# Patient Record
Sex: Female | Born: 1965 | Race: Asian | Hispanic: No | Marital: Married | Smoking: Never smoker
Health system: Southern US, Community
[De-identification: ages and names within clinical notes are randomized; demographics above are authoritative.]

## PROBLEM LIST (undated history)

## (undated) DIAGNOSIS — Z9109 Other allergy status, other than to drugs and biological substances: Secondary | ICD-10-CM

## (undated) DIAGNOSIS — E559 Vitamin D deficiency, unspecified: Secondary | ICD-10-CM

## (undated) DIAGNOSIS — K219 Gastro-esophageal reflux disease without esophagitis: Secondary | ICD-10-CM

## (undated) DIAGNOSIS — T7840XA Allergy, unspecified, initial encounter: Secondary | ICD-10-CM

## (undated) HISTORY — DX: Vitamin D deficiency, unspecified: E55.9

## (undated) HISTORY — PX: ESOPHAGOGASTRODUODENOSCOPY: SHX1529

## (undated) HISTORY — DX: Allergy, unspecified, initial encounter: T78.40XA

---

## 2001-11-16 HISTORY — PX: OVARIAN CYST REMOVAL: SHX89

## 2004-12-02 ENCOUNTER — Ambulatory Visit: Payer: Self-pay | Admitting: Obstetrics and Gynecology

## 2006-11-03 ENCOUNTER — Ambulatory Visit: Payer: Self-pay | Admitting: Obstetrics and Gynecology

## 2007-11-30 ENCOUNTER — Ambulatory Visit: Payer: Self-pay | Admitting: Obstetrics and Gynecology

## 2008-01-06 ENCOUNTER — Ambulatory Visit: Payer: Self-pay | Admitting: Otolaryngology

## 2008-07-02 ENCOUNTER — Ambulatory Visit: Payer: Self-pay | Admitting: Gastroenterology

## 2008-12-04 ENCOUNTER — Ambulatory Visit: Payer: Self-pay | Admitting: Obstetrics and Gynecology

## 2009-01-14 ENCOUNTER — Ambulatory Visit: Payer: Self-pay | Admitting: Gastroenterology

## 2009-12-05 ENCOUNTER — Ambulatory Visit: Payer: Self-pay | Admitting: Obstetrics and Gynecology

## 2011-10-06 ENCOUNTER — Ambulatory Visit: Payer: Self-pay | Admitting: Obstetrics and Gynecology

## 2012-11-17 ENCOUNTER — Ambulatory Visit: Payer: Self-pay | Admitting: Obstetrics and Gynecology

## 2012-12-21 ENCOUNTER — Ambulatory Visit: Payer: Self-pay | Admitting: Family Medicine

## 2013-11-20 ENCOUNTER — Ambulatory Visit: Payer: Self-pay | Admitting: Obstetrics and Gynecology

## 2014-09-21 ENCOUNTER — Ambulatory Visit: Payer: Self-pay

## 2015-11-15 ENCOUNTER — Encounter: Payer: Self-pay | Admitting: Family Medicine

## 2015-12-02 ENCOUNTER — Encounter: Payer: Self-pay | Admitting: Family Medicine

## 2015-12-02 ENCOUNTER — Ambulatory Visit (INDEPENDENT_AMBULATORY_CARE_PROVIDER_SITE_OTHER): Payer: BLUE CROSS/BLUE SHIELD | Admitting: Family Medicine

## 2015-12-02 VITALS — BP 116/62 | HR 99 | Temp 98.6°F | Resp 16 | Ht 63.0 in | Wt 129.9 lb

## 2015-12-02 DIAGNOSIS — J302 Other seasonal allergic rhinitis: Secondary | ICD-10-CM | POA: Insufficient documentation

## 2015-12-02 DIAGNOSIS — Z0000 Encounter for general adult medical examination without abnormal findings: Secondary | ICD-10-CM

## 2015-12-02 DIAGNOSIS — R1013 Epigastric pain: Secondary | ICD-10-CM | POA: Diagnosis not present

## 2015-12-02 MED ORDER — MOMETASONE FUROATE 50 MCG/ACT NA SUSP: 2.0000 | Freq: Every day | NASAL | Status: AC

## 2015-12-02 NOTE — Progress Notes (Signed)
Name: Magdalyn Hoover   MRN: [id number]    DOB: 07-Jun-1966   Date:12/02/2015       Progress Note  Subjective  Chief Complaint  Chief Complaint  Patient presents with  . Annual Exam    HPI  Pt. Is here for Annual Physical Exam. She is doing well.  Past Medical History  Diagnosis Date  . Allergy   . Vitamin D deficiency     No past surgical history on file.  Family History  Problem Relation Age of Onset  . Cancer Brother     testicular    Social History   Social History  . Marital Status: Married    Spouse Name: N/A  . Number of Children: N/A  . Years of Education: N/A   Occupational History  . Not on file.   Social History Main Topics  . Smoking status: Never Smoker   . Smokeless tobacco: Never Used  . Alcohol Use: No  . Drug Use: No  . Sexual Activity: Not Currently   Other Topics Concern  . Not on file   Social History Narrative  . No narrative on file    Current outpatient prescriptions:  .  Loratadine 10 MG CAPS, Take 1 tablet by mouth as needed., Disp: , Rfl:  .  mometasone (NASONEX) 50 MCG/ACT nasal spray, Place 2 sprays into the nose daily., Disp: , Rfl:  .  Vitamin D, Ergocalciferol, (DRISDOL) 50000 units CAPS capsule, Take 1 capsule by mouth daily., Disp: , Rfl: 0  Allergies  Allergen Reactions  . Shellfish Allergy Hives    Review of Systems  Constitutional: Positive for malaise/fatigue (fatigue before menstrual cycle). Negative for fever, chills and weight loss.  HENT: Negative for congestion, ear pain and sore throat.   Eyes: Negative for blurred vision and double vision.  Respiratory: Negative for cough and shortness of breath.   Cardiovascular: Positive for palpitations (when she gets nervous/anxious). Negative for chest pain and leg swelling.  Gastrointestinal: Positive for abdominal pain (periodic left sided upper abdominal pain, an episode may last for a week, worse with stress.). Negative for heartburn, nausea, vomiting, diarrhea,  constipation, blood in stool and melena.  Genitourinary: Negative for dysuria, urgency, frequency and hematuria.  Musculoskeletal: Negative for myalgias (muscle cramps ), back pain and joint pain.  Skin: Negative for itching and rash.  Neurological: Negative for dizziness, focal weakness and headaches.  Psychiatric/Behavioral: Negative for depression. The patient is nervous/anxious and has insomnia.     Objective  Filed Vitals:   12/02/15 1115  BP: 116/62  Pulse: 99  Temp: 98.6 F (37 C)  TempSrc: Oral  Resp: 16  Height: 5\' 3"  (1.6 m)  Weight: 129 lb 14.4 oz (58.922 kg)  SpO2: 99%    Physical Exam  Constitutional: She is oriented to person, place, and time and well-developed, well-nourished, and in no distress.  HENT:  Head: Normocephalic and atraumatic.  Right Ear: Tympanic membrane, external ear and ear canal normal.  Left Ear: Tympanic membrane, external ear and ear canal normal.  Mouth/Throat: Oropharynx is clear and moist. No posterior oropharyngeal erythema.  Bilateral turbinate hypertrophy, inflamed, scant rhinorrhea  Cardiovascular: Normal rate and regular rhythm.   Pulmonary/Chest: Effort normal and breath sounds normal.  Abdominal: Normal appearance. There is no splenomegaly. There is tenderness in the epigastric area and left upper quadrant.  Mild tenderness to palpation over the LUQ and epigastric region, no rebound.  Musculoskeletal:       Right ankle: She exhibits no swelling.  Left ankle: She exhibits no swelling.  Neurological: She is alert and oriented to person, place, and time.  Skin: Skin is warm.  Nursing note and vitals reviewed.   Assessment & Plan  1. Annual physical exam  - CBC with Differential - Comprehensive Metabolic Panel (CMET)  2. Epigastric abdominal pain We will refer to GI for endoscopy to rule out gastric or peptic ulcer disease. - Ambulatory referral to Gastroenterology  3. Seasonal allergies  - mometasone (NASONEX) 50  MCG/ACT nasal spray; Place 2 sprays into the nose daily.  Dispense: 17 g; Refill: 1   Holly Hoover [facility] 12/02/2015 11:26 AM

## 2015-12-03 LAB — COMPREHENSIVE METABOLIC PANEL
ALT: 8 IU/L (ref 0–32)
AST: 17 IU/L (ref 0–40)
Albumin/Globulin Ratio: 1.8 (ref 1.1–2.5)
Albumin: 4.2 g/dL (ref 3.5–5.5)
Alkaline Phosphatase: 59 IU/L (ref 39–117)
BUN/Creatinine Ratio: 15 (ref 9–23)
BUN: 11 mg/dL (ref 6–24)
Bilirubin Total: 0.9 mg/dL (ref 0.0–1.2)
CALCIUM: 9 mg/dL (ref 8.7–10.2)
CO2: 28 mmol/L (ref 18–29)
CREATININE: 0.74 mg/dL (ref 0.57–1.00)
Chloride: 101 mmol/L (ref 96–106)
GFR calc Af Amer: 110 mL/min/{1.73_m2} (ref >59)
GFR, EST NON AFRICAN AMERICAN: 95 mL/min/{1.73_m2} (ref >59)
GLOBULIN, TOTAL: 2.4 g/dL (ref 1.5–4.5)
Glucose: 99 mg/dL (ref 65–99)
Potassium: 4.2 mmol/L (ref 3.5–5.2)
Sodium: 140 mmol/L (ref 134–144)
TOTAL PROTEIN: 6.6 g/dL (ref 6.0–8.5)

## 2015-12-03 LAB — CBC WITH DIFFERENTIAL/PLATELET
Basophils Absolute: 0 10*3/uL (ref 0.0–0.2)
Basos: 0 %
EOS (ABSOLUTE): 0 10*3/uL (ref 0.0–0.4)
EOS: 1 %
HEMATOCRIT: 37.1 % (ref 34.0–46.6)
Hemoglobin: 12.2 g/dL (ref 11.1–15.9)
IMMATURE GRANS (ABS): 0 10*3/uL (ref 0.0–0.1)
IMMATURE GRANULOCYTES: 0 %
LYMPHS: 27 %
Lymphocytes Absolute: 1.2 10*3/uL (ref 0.7–3.1)
MCH: 29.9 pg (ref 26.6–33.0)
MCHC: 32.9 g/dL (ref 31.5–35.7)
MCV: 91 fL (ref 79–97)
Monocytes Absolute: 0.3 10*3/uL (ref 0.1–0.9)
Monocytes: 6 %
NEUTROS PCT: 66 %
Neutrophils Absolute: 3 10*3/uL (ref 1.4–7.0)
PLATELETS: 226 10*3/uL (ref 150–379)
RBC: 4.08 x10E6/uL (ref 3.77–5.28)
RDW: 15.2 % (ref 12.3–15.4)
WBC: 4.4 10*3/uL (ref 3.4–10.8)

## 2015-12-24 ENCOUNTER — Other Ambulatory Visit: Payer: Self-pay | Admitting: Obstetrics and Gynecology

## 2015-12-24 DIAGNOSIS — Z1231 Encounter for screening mammogram for malignant neoplasm of breast: Secondary | ICD-10-CM

## 2015-12-25 ENCOUNTER — Ambulatory Visit
Admission: RE | Admit: 2015-12-25 | Discharge: 2015-12-25 | Disposition: A | Discharge status: Home / Self Care | Payer: BLUE CROSS/BLUE SHIELD | Source: Ambulatory Visit | Attending: Obstetrics and Gynecology | Admitting: Obstetrics and Gynecology

## 2015-12-25 DIAGNOSIS — Z1231 Encounter for screening mammogram for malignant neoplasm of breast: Secondary | ICD-10-CM | POA: Insufficient documentation

## 2016-01-06 DIAGNOSIS — R197 Diarrhea, unspecified: Secondary | ICD-10-CM | POA: Insufficient documentation

## 2016-01-07 ENCOUNTER — Ambulatory Visit (INDEPENDENT_AMBULATORY_CARE_PROVIDER_SITE_OTHER): Payer: BLUE CROSS/BLUE SHIELD | Admitting: Gastroenterology

## 2016-01-07 ENCOUNTER — Encounter: Payer: Self-pay | Admitting: Gastroenterology

## 2016-01-07 VITALS — BP 130/70 | HR 89 | Temp 98.3°F | Ht 63.0 in | Wt 130.0 lb

## 2016-01-07 DIAGNOSIS — G8929 Other chronic pain: Secondary | ICD-10-CM | POA: Diagnosis not present

## 2016-01-07 DIAGNOSIS — R1013 Epigastric pain: Secondary | ICD-10-CM

## 2016-01-07 NOTE — Progress Notes (Signed)
Gastroenterology Consultation  Referring Provider:     Roselee Nova, MD Primary Care Physician:  Keith Rake, MD Primary Gastroenterologist:  Dr. Allen Norris     Reason for Consultation:     Abdominal pain        HPI:   Holly Hoover is a 50 y.o. y/o female referred for consultation & management of abdominal pain by Dr. Keith Rake, MD.  This patient comes today with a history of abdominal pain for many years. The patient reports that she had an upper endoscopy and probably 7 years ago by Dr. Gustavo Lah. The patient was told that she may have irritable bowel syndrome. The patient reports that her abdominal pain is worse with some foods. She also reports that her abdominal pain is worse when she is out in the cold weather. There is no report of any unexplained weight loss. The patient does also state that when she gets anxious her pain increases like when she is about to fly back to [place] to visit family. She denies any black stools or bloody stools. She also denies ever having a colonoscopy in the past. There is no history of any family members with colon cancer colon polyps.  Past Medical History  Diagnosis Date  . Allergy   . Vitamin D deficiency     Past Surgical History  Procedure Laterality Date  . Ovarian cyst removal  2003    Prior to Admission medications   Medication Sig Start Date End Date Taking? Authorizing Provider  Loratadine 10 MG CAPS Take 1 tablet by mouth as needed.   Yes Historical Provider, MD  mometasone (NASONEX) 50 MCG/ACT nasal spray Place 2 sprays into the nose daily. 12/02/15  Yes Roselee Nova, MD  Vitamin D, Ergocalciferol, (DRISDOL) 50000 units CAPS capsule Take 1 capsule by mouth daily. 11/27/15  Yes Historical Provider, MD    Family History  Problem Relation Age of Onset  . Cancer Brother     testicular     Social History  Substance Use Topics  . Smoking status: Never Smoker   . Smokeless tobacco: Never Used  . Alcohol Use: No    Allergies as of  01/07/2016 - Review Complete 01/07/2016  Allergen Reaction Noted  . Lactose intolerance (gi)  01/07/2016  . Shellfish allergy Hives 12/02/2015    Review of Systems:    All systems reviewed and negative except where noted in HPI.   Physical Exam:  BP 130/70 mmHg  Pulse 89  Temp(Src) 98.3 F (36.8 C)  Ht 5\' 3"  (1.6 m)  Wt 130 lb (58.968 kg)  BMI 23.03 kg/m2 No LMP recorded. Patient is not currently having periods (Reason: Perimenopausal). Psych:  Alert and cooperative. Normal mood and affect. General:   Alert,  Well-developed, well-nourished, pleasant and cooperative in NAD Head:  Normocephalic and atraumatic. Eyes:  Sclera clear, no icterus.   Conjunctiva pink. Ears:  Normal auditory acuity. Nose:  No deformity, discharge, or lesions. Mouth:  No deformity or lesions,oropharynx pink & moist. Neck:  Supple; no masses or thyromegaly. Lungs:  Respirations even and unlabored.  Clear throughout to auscultation.   No wheezes, crackles, or rhonchi. No acute distress. Heart:  Regular rate and rhythm; no murmurs, clicks, rubs, or gallops. Abdomen:  Normal bowel sounds.  No bruits.  Soft, non-tender and non-distended without masses, hepatosplenomegaly or hernias noted.  No guarding or rebound tenderness.  Negative Carnett sign.   Rectal:  Deferred.  Msk:  Symmetrical without gross deformities.  Good, equal movement & strength bilaterally. Pulses:  Normal pulses noted. Extremities:  No clubbing or edema.  No cyanosis. Neurologic:  Alert and oriented x3;  grossly normal neurologically. Skin:  Intact without significant lesions or rashes.  No jaundice. Lymph Nodes:  No significant cervical adenopathy. Psych:  Alert and cooperative. Normal mood and affect.  Imaging Studies: Mm Digital Screening Bilateral  12/25/2015  CLINICAL DATA:  Screening. EXAM: DIGITAL SCREENING BILATERAL MAMMOGRAM WITH CAD COMPARISON:  Previous exam(s). ACR Breast Density Category d: The breast tissue is extremely dense,  which lowers the sensitivity of mammography. FINDINGS: There are no findings suspicious for malignancy. Images were processed with CAD. IMPRESSION: No mammographic evidence of malignancy. A result letter of this screening mammogram will be mailed directly to the patient. RECOMMENDATION: Screening mammogram in one year. (Code:SM-B-01Y) BI-RADS CATEGORY  1: Negative. Electronically Signed   By: Ammie Ferrier M.D.   On: 12/25/2015 16:15    Assessment and Plan:   Terri Pele is a 50 y.o. y/o female who comes in today with chronic abdominal pain that appears to the getting worse recently. The patient states that the abdominal pain is in the epigastric area. The patient has tried 4 different types of PPIs in the past and she states that it works for a few days and then starts to cause her to have more pain. The patient will be set up for an EGD to look for a cause of her epigastric pain. The patient will also be set up or a colonoscopy for screening purposes. I have discussed risks & benefits which include, but are not limited to, bleeding, infection, perforation & drug reaction.  The patient agrees with this plan & written consent will be obtained.                                      Note: This dictation was prepared with Dragon dictation along with smaller phrase technology. Any transcriptional errors that result from this process are unintentional.

## 2016-01-08 ENCOUNTER — Other Ambulatory Visit: Payer: Self-pay

## 2016-01-27 ENCOUNTER — Encounter: Payer: Self-pay | Admitting: *Deleted

## 2016-01-30 NOTE — Discharge Instructions (Signed)

## 2016-02-03 ENCOUNTER — Ambulatory Visit: Payer: BLUE CROSS/BLUE SHIELD | Admitting: Anesthesiology

## 2016-02-03 ENCOUNTER — Encounter
Admission: RE | Disposition: A | Discharge status: Home / Self Care | Payer: Self-pay | Source: Ambulatory Visit | Attending: Gastroenterology

## 2016-02-03 ENCOUNTER — Ambulatory Visit
Admission: RE | Admit: 2016-02-03 | Discharge: 2016-02-03 | Disposition: A | Discharge status: Home / Self Care | Payer: BLUE CROSS/BLUE SHIELD | Source: Ambulatory Visit | Attending: Gastroenterology | Admitting: Gastroenterology

## 2016-02-03 DIAGNOSIS — Z79899 Other long term (current) drug therapy: Secondary | ICD-10-CM | POA: Insufficient documentation

## 2016-02-03 DIAGNOSIS — R12 Heartburn: Secondary | ICD-10-CM | POA: Diagnosis not present

## 2016-02-03 DIAGNOSIS — E559 Vitamin D deficiency, unspecified: Secondary | ICD-10-CM | POA: Insufficient documentation

## 2016-02-03 DIAGNOSIS — D120 Benign neoplasm of cecum: Secondary | ICD-10-CM | POA: Diagnosis not present

## 2016-02-03 DIAGNOSIS — K2970 Gastritis, unspecified, without bleeding: Secondary | ICD-10-CM | POA: Insufficient documentation

## 2016-02-03 DIAGNOSIS — K219 Gastro-esophageal reflux disease without esophagitis: Secondary | ICD-10-CM | POA: Insufficient documentation

## 2016-02-03 DIAGNOSIS — D125 Benign neoplasm of sigmoid colon: Secondary | ICD-10-CM | POA: Diagnosis not present

## 2016-02-03 DIAGNOSIS — Z1211 Encounter for screening for malignant neoplasm of colon: Secondary | ICD-10-CM | POA: Insufficient documentation

## 2016-02-03 DIAGNOSIS — Z91013 Allergy to seafood: Secondary | ICD-10-CM | POA: Insufficient documentation

## 2016-02-03 DIAGNOSIS — E739 Lactose intolerance, unspecified: Secondary | ICD-10-CM | POA: Insufficient documentation

## 2016-02-03 DIAGNOSIS — D122 Benign neoplasm of ascending colon: Secondary | ICD-10-CM

## 2016-02-03 HISTORY — DX: Other allergy status, other than to drugs and biological substances: Z91.09

## 2016-02-03 HISTORY — PX: ESOPHAGOGASTRODUODENOSCOPY (EGD) WITH PROPOFOL: SHX5813

## 2016-02-03 HISTORY — PX: COLONOSCOPY WITH PROPOFOL: SHX5780

## 2016-02-03 HISTORY — PX: POLYPECTOMY: SHX5525

## 2016-02-03 HISTORY — DX: Gastro-esophageal reflux disease without esophagitis: K21.9

## 2016-02-03 SURGERY — COLONOSCOPY WITH PROPOFOL
Anesthesia: Monitor Anesthesia Care | Wound class: Contaminated

## 2016-02-03 MED ORDER — PROPOFOL 10 MG/ML IV BOLUS
INTRAVENOUS | Status: DC | PRN
  Administered 2016-02-03: 30 mg via INTRAVENOUS
  Administered 2016-02-03: 120 mg via INTRAVENOUS
  Administered 2016-02-03 (×2): 30 mg via INTRAVENOUS

## 2016-02-03 MED ORDER — GLYCOPYRROLATE 0.2 MG/ML IJ SOLN
INTRAMUSCULAR | Status: DC | PRN
  Administered 2016-02-03: 0.1 mg via INTRAVENOUS

## 2016-02-03 MED ORDER — LIDOCAINE HCL (CARDIAC) 20 MG/ML IV SOLN
INTRAVENOUS | Status: DC | PRN
  Administered 2016-02-03: 50 mg via INTRAVENOUS

## 2016-02-03 MED ORDER — SIMETHICONE 40 MG/0.6ML PO SUSP
ORAL | Status: DC | PRN
  Administered 2016-02-03: 09:00:00

## 2016-02-03 MED ORDER — ACETAMINOPHEN 160 MG/5ML PO SOLN: 325.0000 mg | ORAL | Status: DC | PRN

## 2016-02-03 MED ORDER — LACTATED RINGERS IV SOLN
INTRAVENOUS | Status: DC
  Administered 2016-02-03: 09:00:00 via INTRAVENOUS

## 2016-02-03 MED ORDER — ACETAMINOPHEN 325 MG PO TABS: 325.0000 mg | ORAL_TABLET | ORAL | Status: DC | PRN

## 2016-02-03 SURGICAL SUPPLY — 32 items
BALLN DILATOR 10-12 8 (BALLOONS)
BALLN DILATOR 12-15 8 (BALLOONS)
BALLN DILATOR 15-18 8 (BALLOONS)
BALLN DILATOR CRE 0-12 8 (BALLOONS)
BALLN DILATOR ESOPH 8 10 CRE (MISCELLANEOUS) IMPLANT
BALLOON DILATOR 12-15 8 (BALLOONS) IMPLANT
BALLOON DILATOR 15-18 8 (BALLOONS) IMPLANT
BALLOON DILATOR CRE 0-12 8 (BALLOONS) IMPLANT
BLOCK BITE 60FR ADLT L/F GRN (MISCELLANEOUS) ×3 IMPLANT
CANISTER SUCT 1200ML W/VALVE (MISCELLANEOUS) ×3 IMPLANT
CLIP HMST 235XBRD CATH ROT (MISCELLANEOUS) IMPLANT
CLIP RESOLUTION 360 11X235 (MISCELLANEOUS)
FCP ESCP3.2XJMB 240X2.8X (MISCELLANEOUS)
FORCEPS BIOP RAD 4 LRG CAP 4 (CUTTING FORCEPS) ×3 IMPLANT
FORCEPS BIOP RJ4 240 W/NDL (MISCELLANEOUS)
FORCEPS ESCP3.2XJMB 240X2.8X (MISCELLANEOUS) IMPLANT
GOWN CVR UNV OPN BCK APRN NK (MISCELLANEOUS) ×4 IMPLANT
GOWN ISOL THUMB LOOP REG UNIV (MISCELLANEOUS) ×2
INJECTOR VARIJECT VIN23 (MISCELLANEOUS) IMPLANT
KIT DEFENDO VALVE AND CONN (KITS) IMPLANT
KIT ENDO PROCEDURE OLY (KITS) ×3 IMPLANT
MARKER SPOT ENDO TATTOO 5ML (MISCELLANEOUS) IMPLANT
PAD GROUND ADULT SPLIT (MISCELLANEOUS) IMPLANT
PROBE APC STR FIRE (PROBE) ×3 IMPLANT
SNARE SHORT THROW 13M SML OVAL (MISCELLANEOUS) IMPLANT
SNARE SHORT THROW 30M LRG OVAL (MISCELLANEOUS) IMPLANT
SPOT EX ENDOSCOPIC TATTOO (MISCELLANEOUS)
SYR INFLATION 60ML (SYRINGE) IMPLANT
VARIJECT INJECTOR VIN23 (MISCELLANEOUS)
WATER STERILE IRR 250ML POUR (IV SOLUTION) ×3 IMPLANT
WIDE-EYE POLYPTRAP (MISCELLANEOUS) IMPLANT
WIRE CRE 18-20MM 8CM F G (MISCELLANEOUS) IMPLANT

## 2016-02-03 NOTE — H&P (Signed)
  [facility]  [address], [place], [place] [address] Phone: [phone] Fax : [phone]  Primary Care Physician:  Keith Rake, MD Primary Gastroenterologist:  Dr. Allen Norris  Pre-Procedure History & Physical: HPI:  Holly Hoover is a 50 y.o. female is here for an endoscopy and colonoscopy.   Past Medical History  Diagnosis Date  . Allergy   . Vitamin D deficiency   . GERD (gastroesophageal reflux disease)   . Environmental allergies     Past Surgical History  Procedure Laterality Date  . Ovarian cyst removal  2003  . Esophagogastroduodenoscopy      Prior to Admission medications   Medication Sig Start Date End Date Taking? Authorizing Provider  Loratadine 10 MG CAPS Take 1 tablet by mouth as needed.   Yes Historical Provider, MD  mometasone (NASONEX) 50 MCG/ACT nasal spray Place 2 sprays into the nose daily. 12/02/15  Yes Roselee Nova, MD  VITAMIN D, ERGOCALCIFEROL, PO Take by mouth daily.   Yes Historical Provider, MD    Allergies as of 01/08/2016 - Review Complete 01/07/2016  Allergen Reaction Noted  . Lactose intolerance (gi)  01/07/2016  . Shellfish allergy Hives 12/02/2015    Family History  Problem Relation Age of Onset  . Cancer Brother     testicular    Social History   Social History  . Marital Status: Married    Spouse Name: N/A  . Number of Children: N/A  . Years of Education: N/A   Occupational History  . Not on file.   Social History Main Topics  . Smoking status: Never Smoker   . Smokeless tobacco: Never Used  . Alcohol Use: No     Comment: 1 - 2 drinks/yr  . Drug Use: No  . Sexual Activity: Not Currently   Other Topics Concern  . Not on file   Social History Narrative    Review of Systems: See HPI, otherwise negative ROS  Physical Exam: Ht 5\' 3"  (1.6 m)  Wt 128 lb (58.06 kg)  BMI 22.68 kg/m2  LMP 08/31/2015 (Approximate) General:   Alert,  pleasant and cooperative in NAD Head:  Normocephalic and  atraumatic. Neck:  Supple; no masses or thyromegaly. Lungs:  Clear throughout to auscultation.    Heart:  Regular rate and rhythm. Abdomen:  Soft, nontender and nondistended. Normal bowel sounds, without guarding, and without rebound.   Neurologic:  Alert and  oriented x4;  grossly normal neurologically.  Impression/Plan: Aldean Donaho is here for an endoscopy and colonoscopy to be performed for GERD and screening  Risks, benefits, limitations, and alternatives regarding  endoscopy and colonoscopy have been reviewed with the patient.  Questions have been answered.  All parties agreeable.   Ollen Bowl, MD  02/03/2016, 7:51 AM

## 2016-02-03 NOTE — Op Note (Signed)
[facility] Gastroenterology Patient Name: Holly Hoover Procedure Date: 02/03/2016 8:54 AM MRN: [id number] Account #: [id number] Date of Birth: Apr 17, 1966 Admit Type: Outpatient Age: 50 Room: [facility] OR ROOM 01 Gender: Female Note Status: Finalized Procedure:            Upper GI endoscopy Indications:          Heartburn Providers:            Lucilla Lame, MD Referring MD:         Otila Back. Manuella Ghazi (Referring MD) Medicines:            Propofol per Anesthesia Complications:        No immediate complications. Procedure:            Pre-Anesthesia Assessment:                       - Prior to the procedure, a History and Physical was                        performed, and patient medications and allergies were                        reviewed. The patient's tolerance of previous                        anesthesia was also reviewed. The risks and benefits of                        the procedure and the sedation options and risks were                        discussed with the patient. All questions were                        answered, and informed consent was obtained. Prior                        Anticoagulants: The patient has taken no previous                        anticoagulant or antiplatelet agents. ASA Grade                        Assessment: II - A patient with mild systemic disease.                        After reviewing the risks and benefits, the patient was                        deemed in satisfactory condition to undergo the                        procedure.                       After obtaining informed consent, the endoscope was                        passed under direct vision. Throughout the procedure,  the patient's blood pressure, pulse, and oxygen                        saturations were monitored continuously. The was                        introduced through the mouth, and advanced to the                        second part of duodenum.  The upper GI endoscopy was                        accomplished without difficulty. The patient tolerated                        the procedure well. Findings:      The examined esophagus was normal.      Localized mild inflammation characterized by erythema was found in the       gastric antrum. Biopsies were taken with a cold forceps for histology.      The examined duodenum was normal. Impression:           - Normal esophagus.                       - Gastritis. Biopsied.                       - Normal examined duodenum. Recommendation:       - Await pathology results. Procedure Code(s):    --- Professional ---                       [phone], Esophagogastroduodenoscopy, flexible, transoral;                        with biopsy, single or multiple Diagnosis Code(s):    --- Professional ---                       R12, Heartburn                       K29.70, Gastritis, unspecified, without bleeding CPT copyright 2016 American Medical Association. All rights reserved. The codes documented in this report are preliminary and upon coder review may  be revised to meet current compliance requirements. Lucilla Lame, MD 02/03/2016 9:10:15 AM This report has been signed electronically. Number of Addenda: 0 Note Initiated On: 02/03/2016 8:54 AM      [facility]

## 2016-02-03 NOTE — Anesthesia Postprocedure Evaluation (Signed)
Anesthesia Post Note  Patient: Biomedical engineer  Procedure(s) Performed: Procedure(s) (LRB): COLONOSCOPY WITH PROPOFOL (N/A) ESOPHAGOGASTRODUODENOSCOPY (EGD) WITH PROPOFOL (N/A) POLYPECTOMY  Patient location during evaluation: PACU Anesthesia Type: MAC Level of consciousness: awake and alert Pain management: pain level controlled Vital Signs Assessment: post-procedure vital signs reviewed and stable Respiratory status: spontaneous breathing, nonlabored ventilation and respiratory function stable Cardiovascular status: stable and blood pressure returned to baseline Anesthetic complications: no    Trecia Rogers

## 2016-02-03 NOTE — Anesthesia Procedure Notes (Signed)
Procedure Name: MAC Date/Time: 02/03/2016 9:01 AM Performed by: Cameron Ali Pre-anesthesia Checklist: Patient identified, Emergency Drugs available, Suction available, Timeout performed and Patient being monitored Patient Re-evaluated:Patient Re-evaluated prior to inductionOxygen Delivery Method: Nasal cannula Placement Confirmation: positive ETCO2

## 2016-02-03 NOTE — Anesthesia Preprocedure Evaluation (Signed)
Anesthesia Evaluation  Patient identified by MRN, date of birth, ID band Patient awake    Reviewed: Allergy & Precautions, H&P , NPO status , Patient's Chart, lab work & pertinent test results, reviewed documented beta blocker date and time   Airway Mallampati: III  TM Distance: >3 FB Neck ROM: full    Dental no notable dental hx.    Pulmonary neg pulmonary ROS,    Pulmonary exam normal breath sounds clear to auscultation       Cardiovascular Exercise Tolerance: Good negative cardio ROS Normal cardiovascular exam Rhythm:regular Rate:Normal     Neuro/Psych negative neurological ROS  negative psych ROS   GI/Hepatic Neg liver ROS, GERD  ,  Endo/Other  negative endocrine ROS  Renal/GU negative Renal ROS  negative genitourinary   Musculoskeletal   Abdominal   Peds  Hematology negative hematology ROS (+)   Anesthesia Other Findings   Reproductive/Obstetrics negative OB ROS                             Anesthesia Physical Anesthesia Plan  ASA: II  Anesthesia Plan: MAC   Post-op Pain Management:    Induction: Intravenous  Airway Management Planned: Nasal Cannula  Additional Equipment:   Intra-op Plan:   Post-operative Plan:   Informed Consent: I have reviewed the patients History and Physical, chart, labs and discussed the procedure including the risks, benefits and alternatives for the proposed anesthesia with the patient or authorized representative who has indicated his/her understanding and acceptance.   Dental Advisory Given  Plan Discussed with: CRNA  Anesthesia Plan Comments:         Anesthesia Quick Evaluation

## 2016-02-03 NOTE — Transfer of Care (Signed)
Immediate Anesthesia Transfer of Care Note  Patient: Biomedical engineer  Procedure(s) Performed: Procedure(s): COLONOSCOPY WITH PROPOFOL (N/A) ESOPHAGOGASTRODUODENOSCOPY (EGD) WITH PROPOFOL (N/A) POLYPECTOMY  Patient Location: PACU  Anesthesia Type: MAC  Level of Consciousness: awake, alert  and patient cooperative  Airway and Oxygen Therapy: Patient Spontanous Breathing and Patient connected to supplemental oxygen  Post-op Assessment: Post-op Vital signs reviewed, Patient's Cardiovascular Status Stable, Respiratory Function Stable, Patent Airway and No signs of Nausea or vomiting  Post-op Vital Signs: Reviewed and stable  Complications: No apparent anesthesia complications

## 2016-02-03 NOTE — Op Note (Signed)
[facility] Gastroenterology Patient Name: Holly Hoover Procedure Date: 02/03/2016 8:54 AM MRN: [id number] Account #: [id number] Date of Birth: 08/08/1966 Admit Type: Outpatient Age: 50 Room: [facility] OR ROOM 01 Gender: Female Note Status: Finalized Procedure:            Colonoscopy Indications:          Screening for colorectal malignant neoplasm Providers:            Lucilla Lame, MD Medicines:            Propofol per Anesthesia Complications:        No immediate complications. Procedure:            Pre-Anesthesia Assessment:                       - Prior to the procedure, a History and Physical was                        performed, and patient medications and allergies were                        reviewed. The patient's tolerance of previous                        anesthesia was also reviewed. The risks and benefits of                        the procedure and the sedation options and risks were                        discussed with the patient. All questions were                        answered, and informed consent was obtained. Prior                        Anticoagulants: The patient has taken no previous                        anticoagulant or antiplatelet agents. ASA Grade                        Assessment: II - A patient with mild systemic disease.                        After reviewing the risks and benefits, the patient was                        deemed in satisfactory condition to undergo the                        procedure.                       After obtaining informed consent, the colonoscope was                        passed under direct vision. Throughout the procedure,                        the patient's blood pressure, pulse,  and oxygen                        saturations were monitored continuously. The was                        introduced through the anus and advanced to the the                        cecum, identified by appendiceal orifice and  ileocecal                        valve. The colonoscopy was performed without                        difficulty. The patient tolerated the procedure well.                        The quality of the bowel preparation was excellent. Findings:      The perianal and digital rectal examinations were normal.      A 4 mm polyp was found in the cecum. The polyp was sessile. The polyp       was removed with a cold biopsy forceps. Resection and retrieval were       complete.      A 4 mm polyp was found in the ascending colon. The polyp was sessile.       The polyp was removed with a cold biopsy forceps. Resection and       retrieval were complete.      Two sessile polyps were found in the sigmoid colon. The polyps were 3 to       5 mm in size. These polyps were removed with a cold biopsy forceps.       Resection and retrieval were complete. Impression:           - One 4 mm polyp in the cecum, removed with a cold                        biopsy forceps. Resected and retrieved.                       - One 4 mm polyp in the ascending colon, removed with a                        cold biopsy forceps. Resected and retrieved.                       - Two 3 to 5 mm polyps in the sigmoid colon, removed                        with a cold biopsy forceps. Resected and retrieved. Recommendation:       - Await pathology results.                       - Repeat colonoscopy in 5 years if polyp adenoma and 10                        years if hyperplastic Procedure Code(s):    --- Professional ---  45380, Colonoscopy, flexible; with biopsy, single or                        multiple Diagnosis Code(s):    --- Professional ---                       Z12.11, Encounter for screening for malignant neoplasm                        of colon                       D12.0, Benign neoplasm of cecum                       D12.2, Benign neoplasm of ascending colon                       D12.5, Benign neoplasm of  sigmoid colon CPT copyright 2016 American Medical Association. All rights reserved. The codes documented in this report are preliminary and upon coder review may  be revised to meet current compliance requirements. Lucilla Lame, MD 02/03/2016 9:24:00 AM This report has been signed electronically. Number of Addenda: 0 Note Initiated On: 02/03/2016 8:54 AM Scope Withdrawal Time: 0 hours 7 minutes 33 seconds  Total Procedure Duration: 0 hours 9 minutes 45 seconds       [facility]

## 2016-02-04 ENCOUNTER — Encounter: Payer: Self-pay | Admitting: Gastroenterology

## 2016-02-10 ENCOUNTER — Encounter: Payer: Self-pay | Admitting: Gastroenterology

## 2016-03-02 ENCOUNTER — Encounter: Payer: Self-pay | Admitting: Gastroenterology

## 2016-03-02 ENCOUNTER — Telehealth: Payer: Self-pay

## 2016-03-02 NOTE — Telephone Encounter (Signed)
Please review pathology 

## 2017-02-26 ENCOUNTER — Other Ambulatory Visit: Payer: Self-pay | Admitting: Obstetrics and Gynecology

## 2017-02-26 DIAGNOSIS — Z1231 Encounter for screening mammogram for malignant neoplasm of breast: Secondary | ICD-10-CM

## 2017-03-18 ENCOUNTER — Ambulatory Visit
Admission: RE | Admit: 2017-03-18 | Discharge: 2017-03-18 | Disposition: A | Discharge status: Home / Self Care | Payer: BLUE CROSS/BLUE SHIELD | Source: Ambulatory Visit | Attending: Obstetrics and Gynecology | Admitting: Obstetrics and Gynecology

## 2017-03-18 DIAGNOSIS — Z1231 Encounter for screening mammogram for malignant neoplasm of breast: Secondary | ICD-10-CM | POA: Diagnosis present

## 2017-05-14 IMAGING — MG MM DIGITAL SCREENING BILAT W/ CAD
4 series · 4 of 4 positions shown · non-contrast
Comparison: Previous exam(s).

CLINICAL DATA: Screening.

EXAM:
DIGITAL SCREENING BILATERAL MAMMOGRAM WITH CAD

[R MLO]
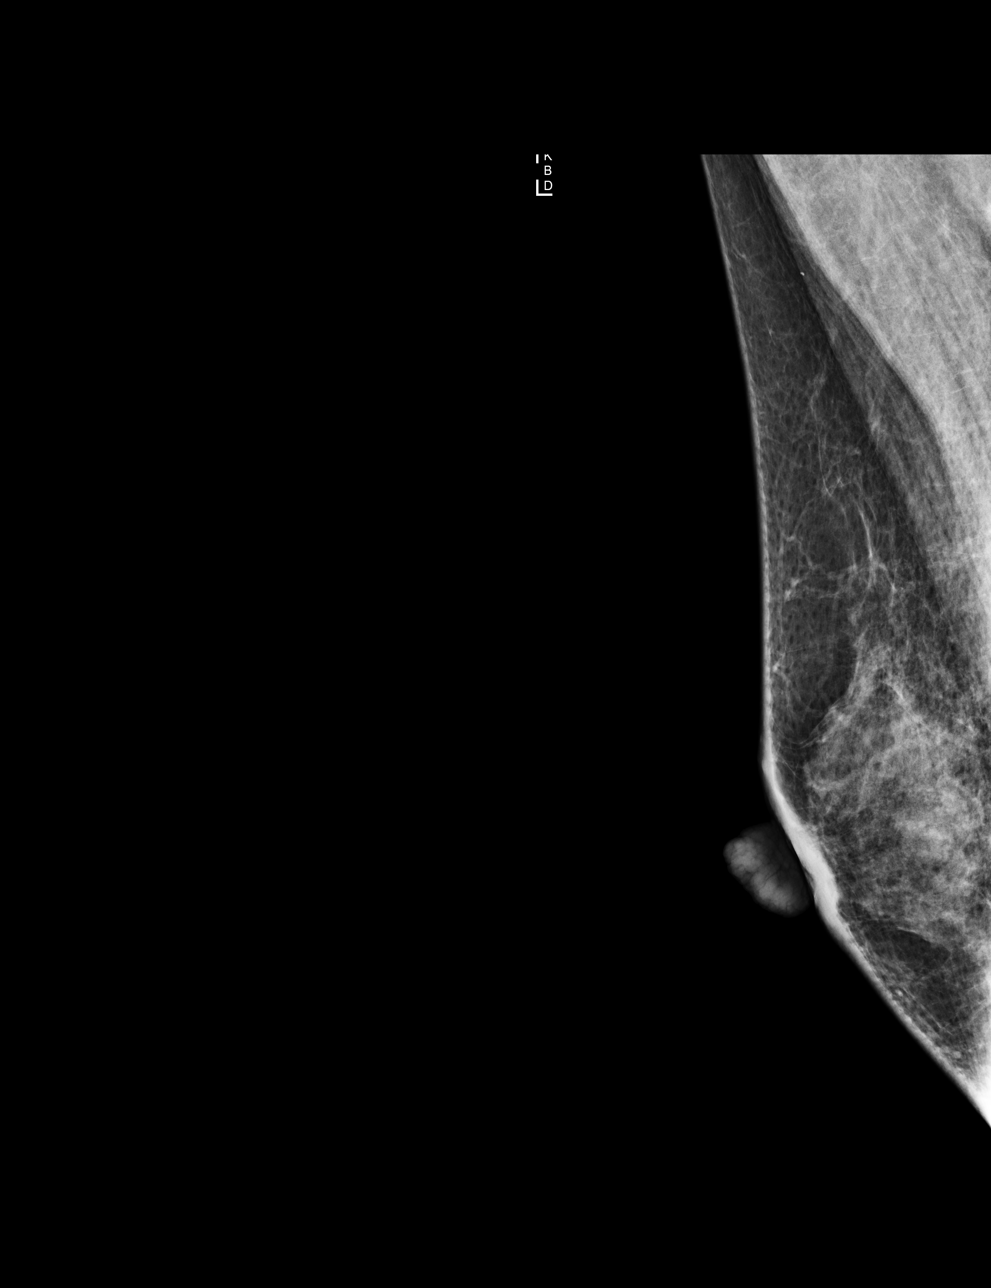

[L MLO]
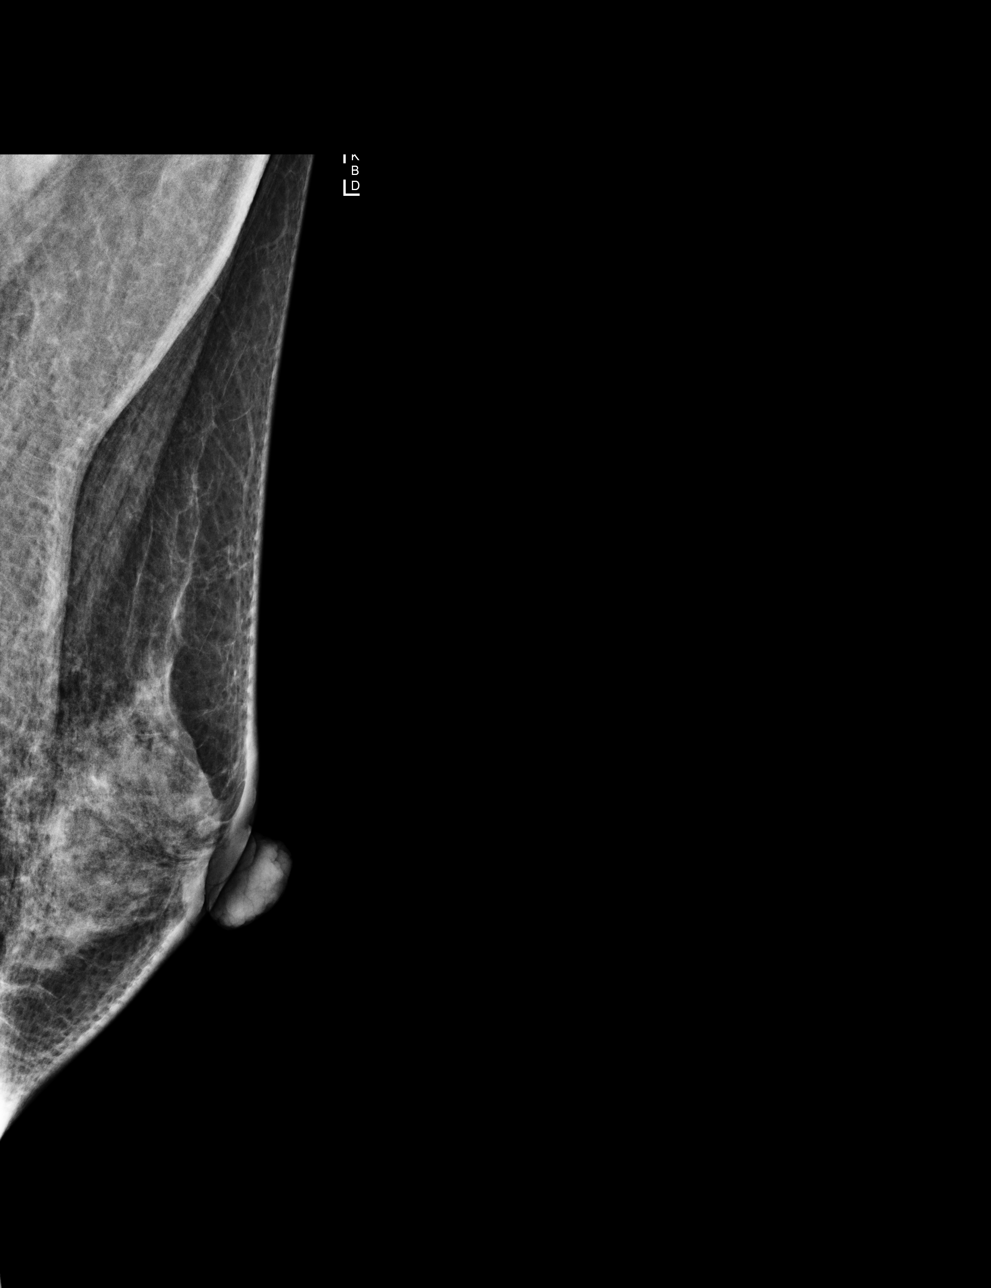

[R CC]
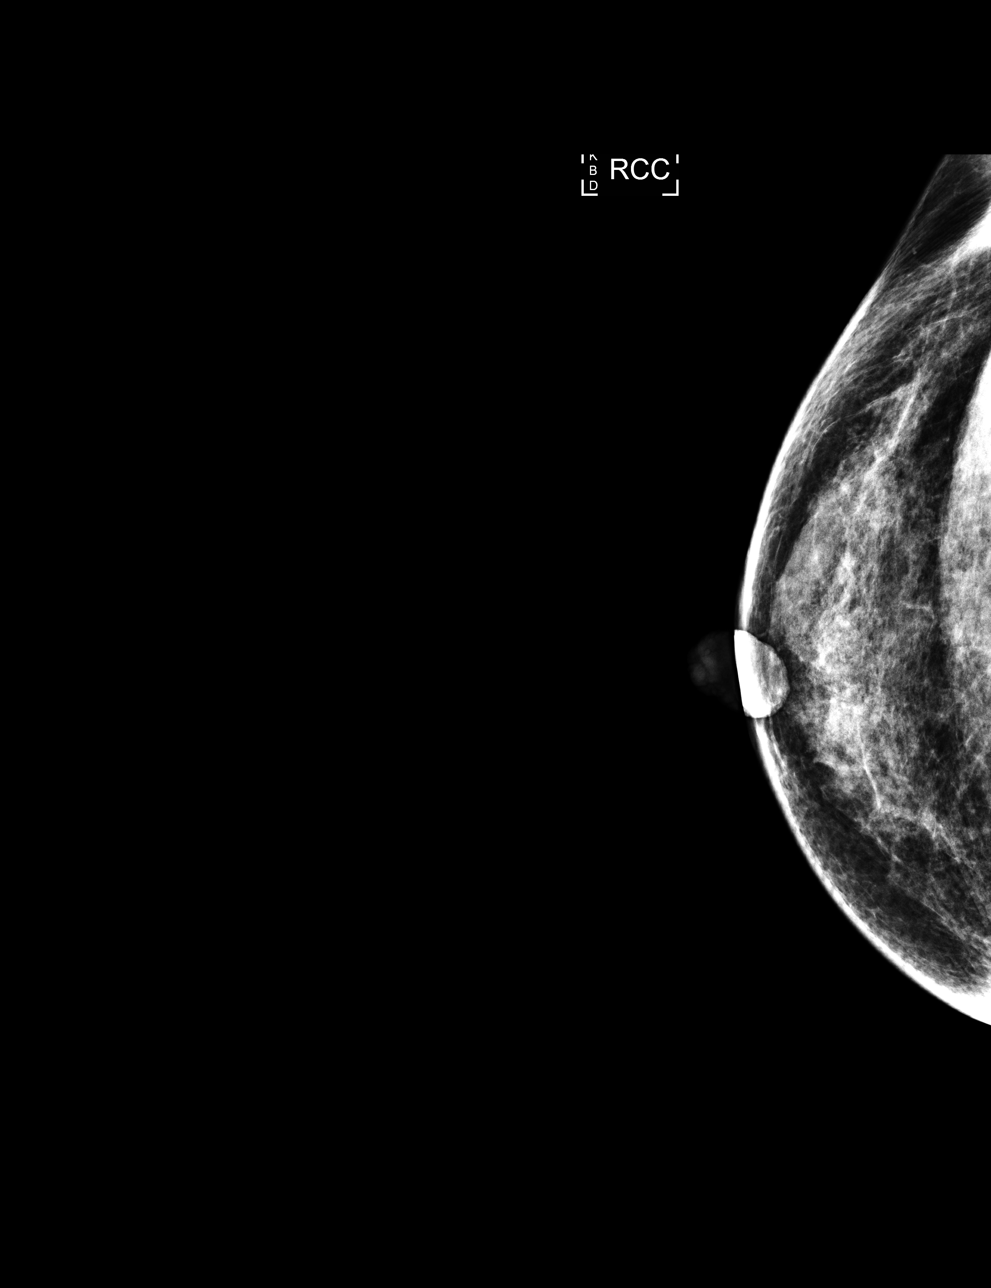

[L CC]
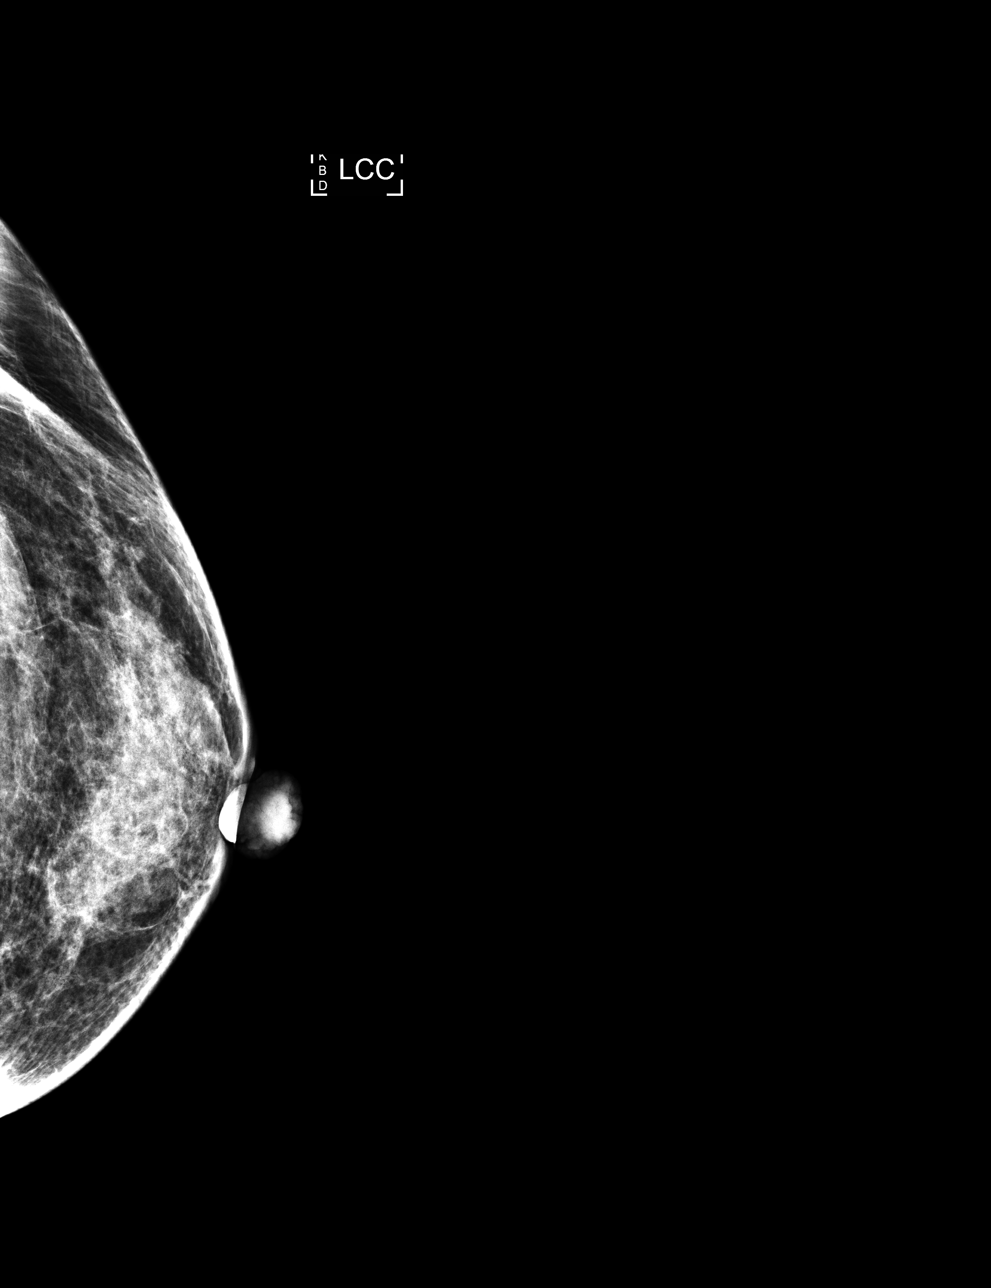

[4 of 4 positions shown; findings below may reference images not displayed]

ACR Breast Density Category d: The breast tissue is extremely dense,
which lowers the sensitivity of mammography.
FINDINGS: There are no findings suspicious for malignancy. Images were
processed with CAD.
IMPRESSION: No mammographic evidence of malignancy. A result letter of this
screening mammogram will be mailed directly to the patient.

RECOMMENDATION:
Screening mammogram in one year. (Code:BD-D-K0F)

BI-RADS CATEGORY  1: Negative.

## 2018-02-21 ENCOUNTER — Other Ambulatory Visit: Payer: Self-pay | Admitting: Obstetrics and Gynecology

## 2018-02-21 DIAGNOSIS — Z1231 Encounter for screening mammogram for malignant neoplasm of breast: Secondary | ICD-10-CM

## 2018-02-24 ENCOUNTER — Other Ambulatory Visit: Payer: Self-pay | Admitting: General Surgery

## 2018-02-24 DIAGNOSIS — R1032 Left lower quadrant pain: Secondary | ICD-10-CM

## 2018-03-01 ENCOUNTER — Ambulatory Visit
Admission: RE | Admit: 2018-03-01 | Discharge: 2018-03-01 | Disposition: A | Discharge status: Home / Self Care | Payer: BLUE CROSS/BLUE SHIELD | Source: Ambulatory Visit | Attending: General Surgery | Admitting: General Surgery

## 2018-03-01 DIAGNOSIS — R1032 Left lower quadrant pain: Secondary | ICD-10-CM | POA: Insufficient documentation

## 2018-03-01 MED ORDER — IOHEXOL 300 MG/ML  SOLN
75.0000 mL | Freq: Once | INTRAMUSCULAR | Status: AC | PRN
  Administered 2018-03-01: 75 mL via INTRAVENOUS

## 2018-03-21 ENCOUNTER — Ambulatory Visit
Admission: RE | Admit: 2018-03-21 | Discharge: 2018-03-21 | Disposition: A | Discharge status: Home / Self Care | Payer: BLUE CROSS/BLUE SHIELD | Source: Ambulatory Visit | Attending: Obstetrics and Gynecology | Admitting: Obstetrics and Gynecology

## 2018-03-21 DIAGNOSIS — Z1231 Encounter for screening mammogram for malignant neoplasm of breast: Secondary | ICD-10-CM | POA: Diagnosis present

## 2019-01-12 ENCOUNTER — Other Ambulatory Visit: Payer: Self-pay | Admitting: Internal Medicine

## 2019-01-12 DIAGNOSIS — R1013 Epigastric pain: Secondary | ICD-10-CM

## 2019-01-18 ENCOUNTER — Other Ambulatory Visit: Payer: Self-pay

## 2019-01-18 ENCOUNTER — Ambulatory Visit
Admission: RE | Admit: 2019-01-18 | Discharge: 2019-01-18 | Disposition: A | Discharge status: Home / Self Care | Payer: BLUE CROSS/BLUE SHIELD | Source: Ambulatory Visit | Attending: Internal Medicine | Admitting: Internal Medicine

## 2019-01-18 DIAGNOSIS — R1013 Epigastric pain: Secondary | ICD-10-CM | POA: Diagnosis present

## 2019-01-25 ENCOUNTER — Other Ambulatory Visit: Payer: Self-pay

## 2019-01-25 ENCOUNTER — Other Ambulatory Visit: Payer: Self-pay | Admitting: Gastroenterology

## 2019-01-25 DIAGNOSIS — R1011 Right upper quadrant pain: Secondary | ICD-10-CM

## 2019-02-03 ENCOUNTER — Ambulatory Visit
Admission: RE | Admit: 2019-02-03 | Discharge: 2019-02-03 | Disposition: A | Discharge status: Home / Self Care | Payer: BLUE CROSS/BLUE SHIELD | Source: Ambulatory Visit | Attending: Gastroenterology | Admitting: Gastroenterology

## 2019-02-03 ENCOUNTER — Other Ambulatory Visit: Payer: Self-pay

## 2019-02-03 DIAGNOSIS — R1011 Right upper quadrant pain: Secondary | ICD-10-CM | POA: Diagnosis present

## 2019-02-03 MED ORDER — TECHNETIUM TC 99M MEBROFENIN IV KIT: 5.5810 | PACK | Freq: Once | INTRAVENOUS | Status: DC | PRN

## 2019-06-29 ENCOUNTER — Other Ambulatory Visit: Payer: Self-pay

## 2019-06-29 DIAGNOSIS — Z20822 Contact with and (suspected) exposure to covid-19: Secondary | ICD-10-CM

## 2019-07-01 LAB — NOVEL CORONAVIRUS, NAA: SARS-CoV-2, NAA: NOT DETECTED

## 2019-07-05 ENCOUNTER — Other Ambulatory Visit: Payer: Self-pay | Admitting: Internal Medicine

## 2019-07-05 DIAGNOSIS — Z1231 Encounter for screening mammogram for malignant neoplasm of breast: Secondary | ICD-10-CM

## 2019-08-10 ENCOUNTER — Ambulatory Visit
Admission: RE | Admit: 2019-08-10 | Discharge: 2019-08-10 | Disposition: A | Discharge status: Home / Self Care | Payer: BC Managed Care – PPO | Source: Ambulatory Visit | Attending: Internal Medicine | Admitting: Internal Medicine

## 2019-08-10 DIAGNOSIS — Z1231 Encounter for screening mammogram for malignant neoplasm of breast: Secondary | ICD-10-CM | POA: Insufficient documentation

## 2020-02-11 IMAGING — US US ABDOMEN COMPLETE
1 series · 14 of 25 positions shown · non-contrast
Comparison: CT abdomen pelvis 03/01/2018

CLINICAL DATA: Epigastric pain

EXAM:
ABDOMEN ULTRASOUND COMPLETE

[Series 1: us abdomen complete · 0.14mm/px · 14 of 103 slices shown]
[im 1/103]
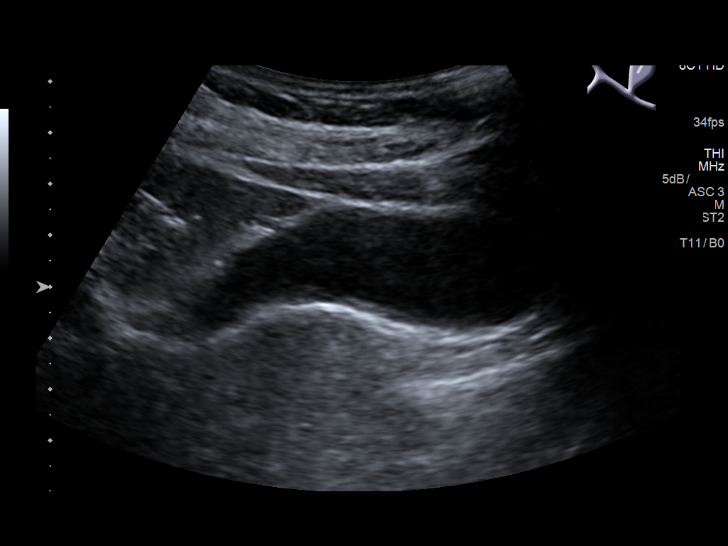
[im 9/103]
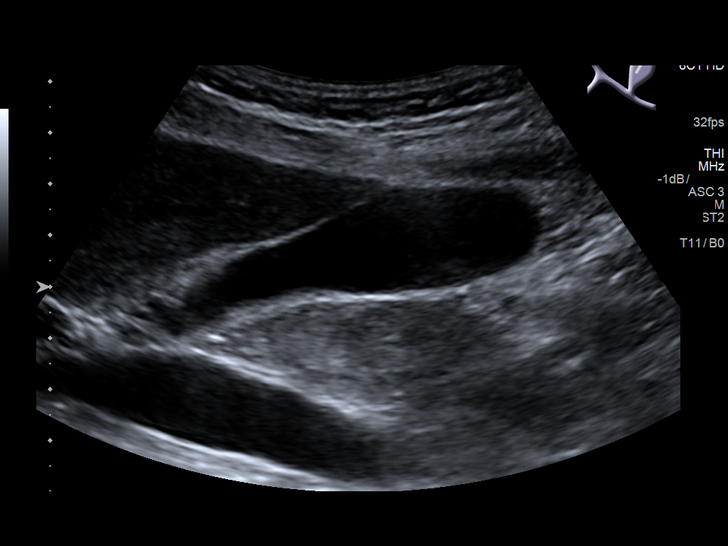
[im 18/103]
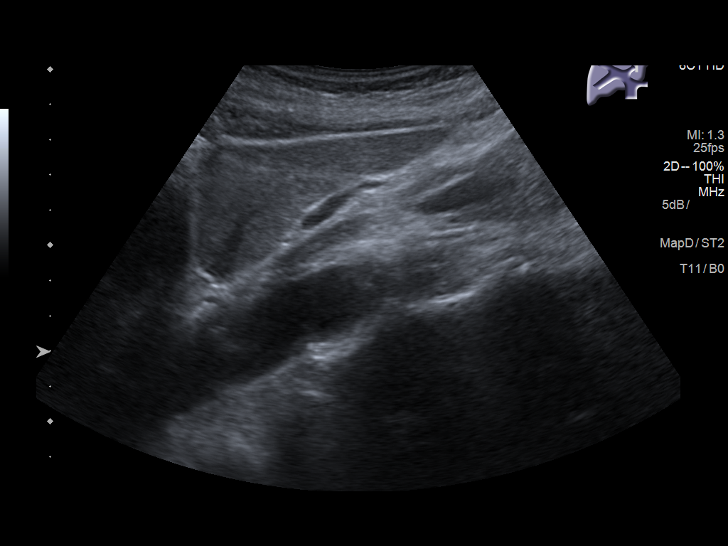
[im 26/103]
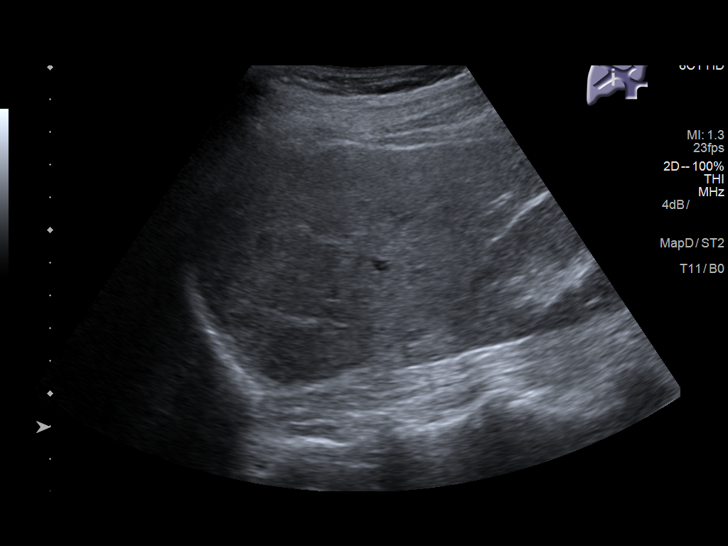
[im 35/103]
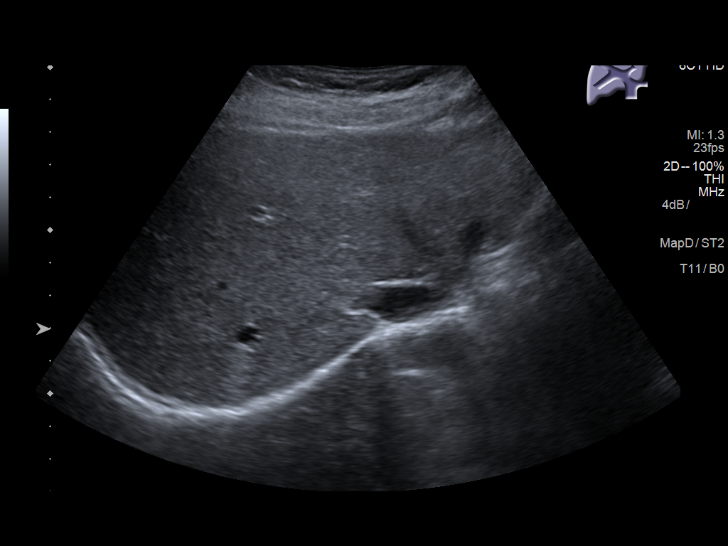
[im 39/103]
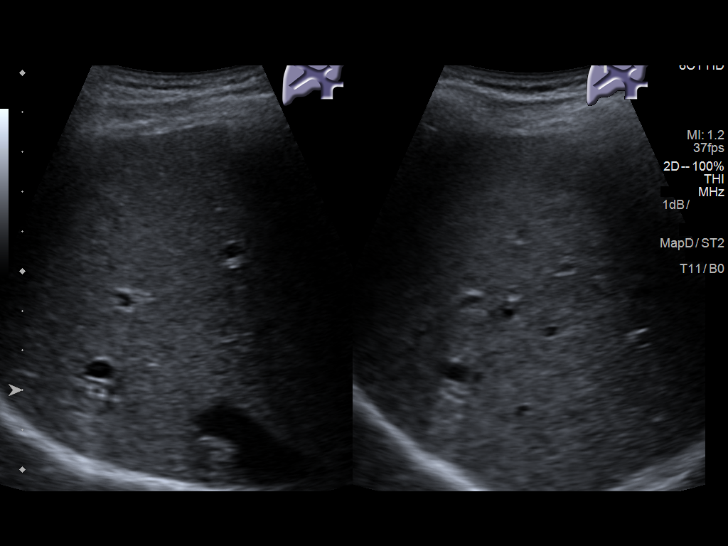
[im 47/103]
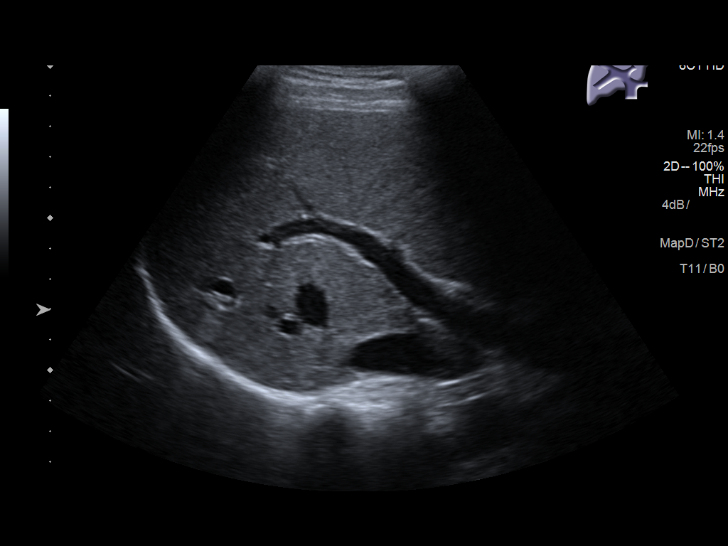
[im 56/103]
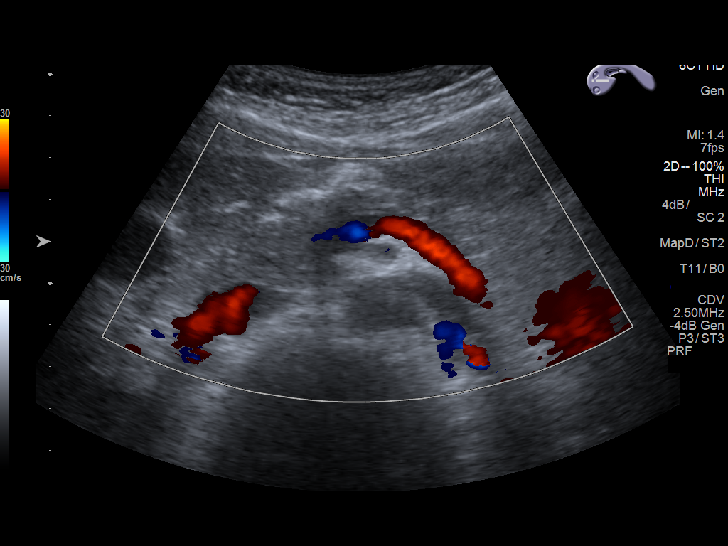
[im 64/103]
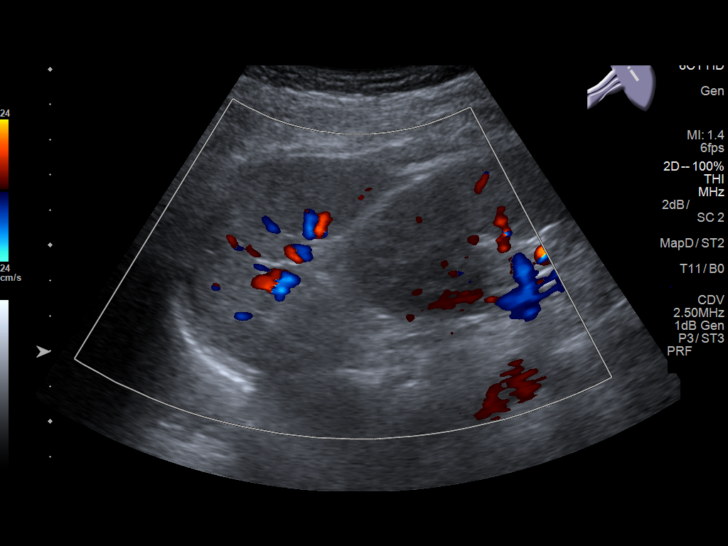
[im 69/103]
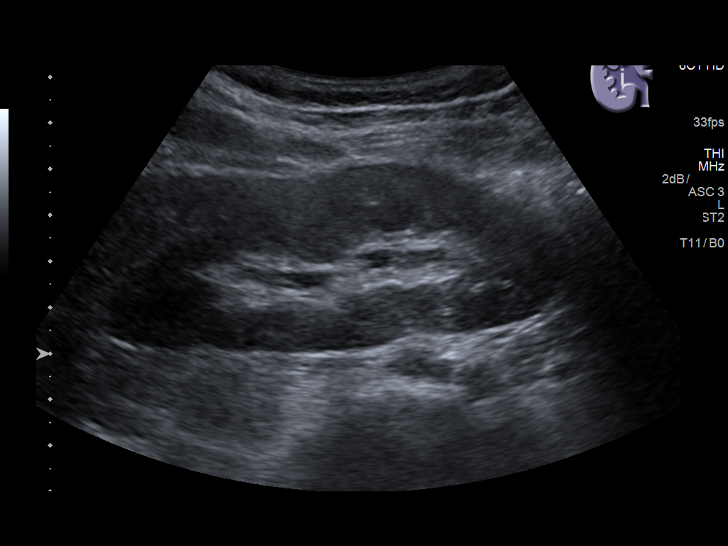
[im 77/103]
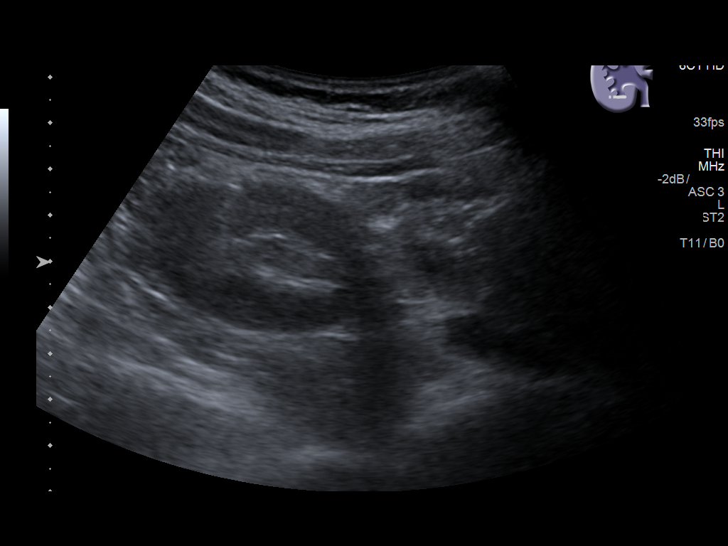
[im 86/103]
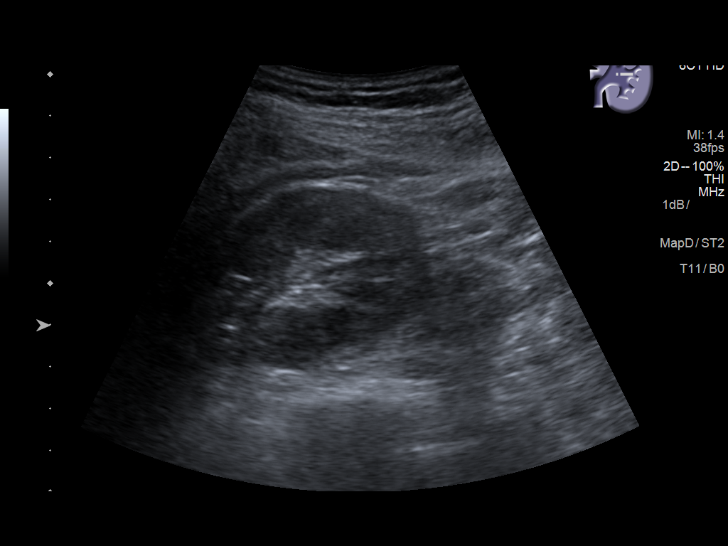
[im 94/103]
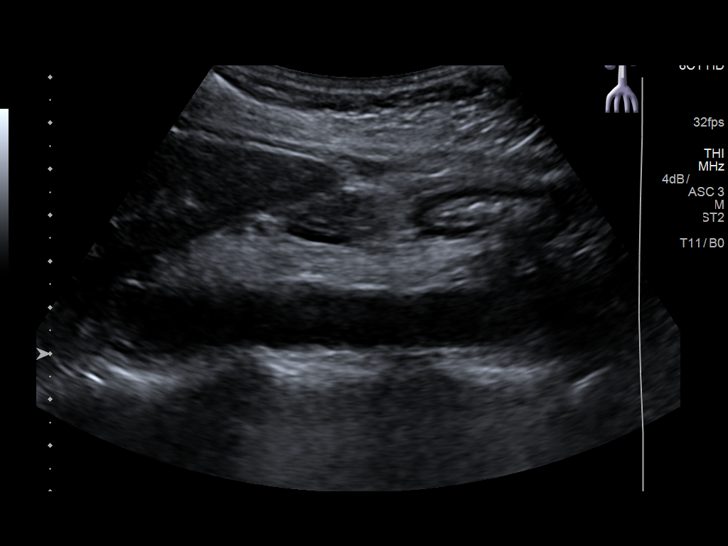
[im 103/103]
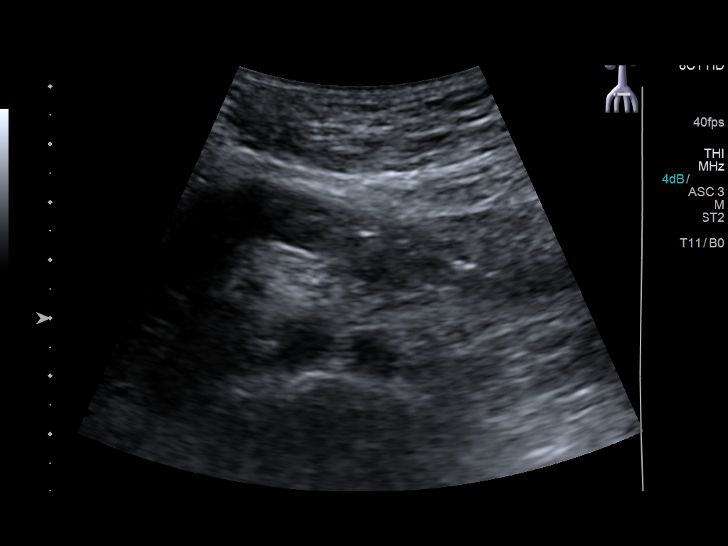

[14 of 25 positions shown; findings below may reference images not displayed]

FINDINGS: Gallbladder: No gallstones or wall thickening visualized. No
sonographic Murphy sign noted by sonographer.

Common bile duct: Diameter: 1.9 mm

Liver: Small renal cysts. No mass. Normal liver echogenicity. Portal
vein is patent on color Doppler imaging with normal direction of
blood flow towards the liver.

IVC: No abnormality visualized.

Pancreas: Visualized portion unremarkable.

Spleen: Size and appearance within normal limits.

Right Kidney: Length: 10.1 cm. Echogenicity within normal limits. No
mass or hydronephrosis visualized.

Left Kidney: Length: 9.7 cm. Echogenicity within normal limits. No
mass or hydronephrosis visualized.

Abdominal aorta: No aneurysm visualized.

Other findings: None.
IMPRESSION: Negative ultrasound of the abdomen.  Small benign renal cysts.

## 2020-04-19 ENCOUNTER — Other Ambulatory Visit: Payer: Self-pay | Admitting: Internal Medicine

## 2020-04-19 DIAGNOSIS — G8929 Other chronic pain: Secondary | ICD-10-CM

## 2020-05-02 ENCOUNTER — Ambulatory Visit: Payer: BC Managed Care – PPO

## 2020-05-07 ENCOUNTER — Other Ambulatory Visit: Payer: Self-pay | Admitting: Obstetrics and Gynecology

## 2020-05-07 DIAGNOSIS — Z1231 Encounter for screening mammogram for malignant neoplasm of breast: Secondary | ICD-10-CM

## 2020-08-13 ENCOUNTER — Ambulatory Visit
Admission: RE | Admit: 2020-08-13 | Discharge: 2020-08-13 | Disposition: A | Discharge status: Home / Self Care | Payer: BC Managed Care – PPO | Source: Ambulatory Visit | Attending: Obstetrics and Gynecology | Admitting: Obstetrics and Gynecology

## 2020-08-13 ENCOUNTER — Other Ambulatory Visit: Payer: Self-pay

## 2020-08-13 DIAGNOSIS — Z1231 Encounter for screening mammogram for malignant neoplasm of breast: Secondary | ICD-10-CM

## 2021-08-20 ENCOUNTER — Other Ambulatory Visit: Payer: Self-pay | Admitting: Internal Medicine

## 2021-08-20 DIAGNOSIS — Z1231 Encounter for screening mammogram for malignant neoplasm of breast: Secondary | ICD-10-CM

## 2021-09-03 ENCOUNTER — Ambulatory Visit
Admission: RE | Admit: 2021-09-03 | Discharge: 2021-09-03 | Disposition: A | Discharge status: Home / Self Care | Payer: BC Managed Care – PPO | Source: Ambulatory Visit | Attending: Internal Medicine | Admitting: Internal Medicine

## 2021-09-03 ENCOUNTER — Other Ambulatory Visit: Payer: Self-pay

## 2021-09-03 DIAGNOSIS — Z1231 Encounter for screening mammogram for malignant neoplasm of breast: Secondary | ICD-10-CM | POA: Diagnosis not present

## 2022-08-13 DIAGNOSIS — R7989 Other specified abnormal findings of blood chemistry: Secondary | ICD-10-CM | POA: Diagnosis not present

## 2022-08-13 DIAGNOSIS — J302 Other seasonal allergic rhinitis: Secondary | ICD-10-CM | POA: Diagnosis not present

## 2022-08-13 DIAGNOSIS — J3089 Other allergic rhinitis: Secondary | ICD-10-CM | POA: Diagnosis not present

## 2022-08-13 DIAGNOSIS — Z0000 Encounter for general adult medical examination without abnormal findings: Secondary | ICD-10-CM | POA: Diagnosis not present

## 2022-08-20 DIAGNOSIS — R7989 Other specified abnormal findings of blood chemistry: Secondary | ICD-10-CM | POA: Diagnosis not present

## 2022-08-20 DIAGNOSIS — D72819 Decreased white blood cell count, unspecified: Secondary | ICD-10-CM | POA: Diagnosis not present

## 2022-08-20 DIAGNOSIS — J3089 Other allergic rhinitis: Secondary | ICD-10-CM | POA: Diagnosis not present

## 2022-08-20 DIAGNOSIS — Z23 Encounter for immunization: Secondary | ICD-10-CM | POA: Diagnosis not present

## 2023-02-17 ENCOUNTER — Other Ambulatory Visit: Payer: Self-pay

## 2023-02-17 DIAGNOSIS — Z1231 Encounter for screening mammogram for malignant neoplasm of breast: Secondary | ICD-10-CM

## 2023-03-09 ENCOUNTER — Ambulatory Visit
Admission: RE | Admit: 2023-03-09 | Discharge: 2023-03-09 | Disposition: A | Discharge status: Home / Self Care | Payer: 59 | Source: Ambulatory Visit | Attending: Obstetrics and Gynecology | Admitting: Obstetrics and Gynecology

## 2023-03-09 ENCOUNTER — Other Ambulatory Visit: Payer: Self-pay | Admitting: Obstetrics and Gynecology

## 2023-03-09 DIAGNOSIS — Z1231 Encounter for screening mammogram for malignant neoplasm of breast: Secondary | ICD-10-CM | POA: Diagnosis not present

## 2024-08-24 ENCOUNTER — Other Ambulatory Visit: Payer: Self-pay | Admitting: Internal Medicine

## 2024-08-24 DIAGNOSIS — Z1231 Encounter for screening mammogram for malignant neoplasm of breast: Secondary | ICD-10-CM

## 2024-10-04 ENCOUNTER — Ambulatory Visit
Admission: RE | Admit: 2024-10-04 | Discharge: 2024-10-04 | Disposition: A | Discharge status: Home / Self Care | Source: Ambulatory Visit | Attending: Internal Medicine | Admitting: Internal Medicine

## 2024-10-04 DIAGNOSIS — Z1231 Encounter for screening mammogram for malignant neoplasm of breast: Secondary | ICD-10-CM | POA: Insufficient documentation
